# Patient Record
Sex: Male | Born: 1975 | Hispanic: Yes | Marital: Married | Smoking: Never smoker
Health system: Southern US, Community
[De-identification: ages and names within clinical notes are randomized; demographics above are authoritative.]

---

## 2019-04-19 ENCOUNTER — Encounter: Payer: Self-pay | Admitting: Emergency Medicine

## 2019-04-19 ENCOUNTER — Emergency Department
Admission: EM | Admit: 2019-04-19 | Discharge: 2019-04-19 | Disposition: A | Discharge status: Home / Self Care | Payer: Self-pay | Attending: Emergency Medicine | Admitting: Emergency Medicine

## 2019-04-19 ENCOUNTER — Other Ambulatory Visit: Payer: Self-pay

## 2019-04-19 ENCOUNTER — Emergency Department: Payer: Self-pay

## 2019-04-19 DIAGNOSIS — R0789 Other chest pain: Secondary | ICD-10-CM | POA: Insufficient documentation

## 2019-04-19 DIAGNOSIS — J189 Pneumonia, unspecified organism: Secondary | ICD-10-CM

## 2019-04-19 DIAGNOSIS — J181 Lobar pneumonia, unspecified organism: Secondary | ICD-10-CM | POA: Insufficient documentation

## 2019-04-19 DIAGNOSIS — Z20828 Contact with and (suspected) exposure to other viral communicable diseases: Secondary | ICD-10-CM | POA: Insufficient documentation

## 2019-04-19 LAB — BASIC METABOLIC PANEL
Anion gap: 8 (ref 5–15)
BUN: 20 mg/dL (ref 6–20)
CO2: 28 mmol/L (ref 22–32)
Calcium: 9.4 mg/dL (ref 8.9–10.3)
Chloride: 105 mmol/L (ref 98–111)
Creatinine, Ser: 0.91 mg/dL (ref 0.61–1.24)
GFR calc Af Amer: >60 mL/min (ref >60)
GFR calc non Af Amer: >60 mL/min (ref >60)
Glucose, Bld: 96 mg/dL (ref 70–99)
Potassium: 4 mmol/L (ref 3.5–5.1)
Sodium: 141 mmol/L (ref 135–145)

## 2019-04-19 LAB — CBC
HCT: 45.1 % (ref 39.0–52.0)
Hemoglobin: 15.4 g/dL (ref 13.0–17.0)
MCH: 30.4 pg (ref 26.0–34.0)
MCHC: 34.1 g/dL (ref 30.0–36.0)
MCV: 89.1 fL (ref 80.0–100.0)
Platelets: 200 10*3/uL (ref 150–400)
RBC: 5.06 MIL/uL (ref 4.22–5.81)
RDW: 12.1 % (ref 11.5–15.5)
WBC: 3.2 10*3/uL — ABNORMAL LOW (ref 4.0–10.5)
nRBC: 0 % (ref 0.0–0.2)

## 2019-04-19 LAB — TROPONIN I: Troponin I: <0.03 ng/mL (ref <0.03)

## 2019-04-19 MED ORDER — NAPROXEN 375 MG PO TABS: 375.0000 mg | ORAL_TABLET | Freq: Two times a day (BID) | ORAL | 0 refills | Status: AC

## 2019-04-19 MED ORDER — AZITHROMYCIN 250 MG PO TABS: ORAL_TABLET | ORAL | 0 refills | Status: DC

## 2019-04-19 MED ORDER — AZITHROMYCIN 250 MG PO TABS: ORAL_TABLET | ORAL | 0 refills | Status: AC

## 2019-04-19 MED ORDER — SODIUM CHLORIDE 0.9% FLUSH: 3.0000 mL | Freq: Once | INTRAVENOUS | Status: DC

## 2019-04-19 MED ORDER — KETOROLAC TROMETHAMINE 60 MG/2ML IM SOLN
60.0000 mg | Freq: Once | INTRAMUSCULAR | Status: AC
  Administered 2019-04-19: 60 mg via INTRAMUSCULAR
  Filled 2019-04-19: qty 2

## 2019-04-19 NOTE — ED Notes (Signed)
Pt reports intermittent chest pain over the last year. Pt reports over the last week it has been worse. Pt reports that the pain comes and goes, pt is not having pain right now. Pain is located in the left chest and does not radiate. Pt denies cardiac history. Pt reports that he did take 2 81 mg Aspirin today and that did help with the pain.

## 2019-04-19 NOTE — ED Triage Notes (Signed)
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  States some SOB with pain, denies n/v/d.  Pt presents A&Ox4, chest rise even and unlabored, using spanish interpreter. 

## 2019-04-19 NOTE — ED Provider Notes (Signed)
[facility] Emergency Department Provider Note  ____________________________________________   First MD Initiated Contact with Patient 04/19/19 1141     (approximate)  I have reviewed the triage vital signs and the nursing notes.   HISTORY  Chief Complaint Chest Pain    HPI Philip Pope is a 43 y.o. male  Here with intermittent left sided chest pain. 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 He states he often coughs at work 2/2 the dust from his stone and this makes it worse, after coughing. Denies any pleuritic component. No SOB, hemoptysis. No leg swelling. His pain is worse w/ movement, palpation. No diaphoresis. No alleviating factors other than rest away from work. Has not taken anything regularly for it.     History reviewed. No pertinent past medical history.  There are no active problems to display for this patient.   History reviewed. No pertinent surgical history.  Prior to Admission medications   Medication Sig Start Date End Date Taking? Authorizing Provider  azithromycin (ZITHROMAX Z-PAK) 250 MG tablet Take 2 tablets (500 mg) on  Day 1,  followed by 1 tablet (250 mg) once daily on Days 2 through 5. 04/19/19 04/24/19  Shaune PollackIsaacs, Renatta Shrieves, MD  naproxen (NAPROSYN) 375 MG tablet Take 1 tablet (375 mg total) by mouth 2 (two) times daily with a meal for 10 days. 04/19/19 04/29/19  Shaune PollackIsaacs, Junius Faucett, MD    Allergies Patient has no known allergies.  History reviewed. No pertinent family history.  Social History Social History   Tobacco Use  . Smoking status: Never Smoker  . Smokeless tobacco: Never Used  Substance Use Topics  . Alcohol use: Never    Frequency: Never  . Drug use: Never    Review of Systems    Review of Systems  Constitutional: Negative for chills, fatigue and  fever.  HENT: Negative for congestion and rhinorrhea.   Eyes: Negative for visual disturbance.  Respiratory: Positive for cough (when exposed to dust at work) and chest tightness. Negative for shortness of breath.   Cardiovascular: Positive for chest pain.  Gastrointestinal: Negative for abdominal pain, diarrhea, nausea and vomiting.  Genitourinary: Negative for dysuria and flank pain.  Skin: Negative for rash and wound.  Neurological: Negative for weakness and light-headedness.  All other systems reviewed and are negative.    ____________________________________________  PHYSICAL EXAM:      VITAL SIGNS: ED Triage Vitals  Enc Vitals Group     BP 04/19/19 1119 123/84     Pulse Rate 04/19/19 1119 71     Resp 04/19/19 1119 14     Temp 04/19/19 1119 98.3 F (36.8 C)     Temp Source 04/19/19 1119 Oral     SpO2 04/19/19 1119 99 %     Weight 04/19/19 1120 200 lb (90.7 kg)     Height 04/19/19 1120 5\' 10"  (1.778 m)     Head Circumference --      Peak Flow --      Pain Score 04/19/19 1120 4     Pain Loc --      Pain Edu? --      Excl. in GC? --      Physical Exam Vitals signs and nursing note reviewed.  Constitutional:      General: He is not in acute distress.  Appearance: He is well-developed.  HENT:     Head: Normocephalic and atraumatic.  Eyes:     Conjunctiva/sclera: Conjunctivae normal.     Pupils: Pupils are equal, round, and reactive to light.  Neck:     Musculoskeletal: Neck supple.  Cardiovascular:     Rate and Rhythm: Normal rate and regular rhythm.     Heart sounds: Normal heart sounds. No murmur. No friction rub.  Pulmonary:     Effort: Pulmonary effort is normal. No respiratory distress.     Breath sounds: Normal breath sounds. No wheezing or rales.  Chest:    Abdominal:     General: There is no distension.     Palpations: Abdomen is soft.     Tenderness: There is no abdominal tenderness.  Skin:    General: Skin is warm.     Capillary Refill:  Capillary refill takes less than 2 seconds.  Neurological:     Mental Status: He is alert and oriented to person, place, and time.     Motor: No abnormal muscle tone.       ____________________________________________   LABS (all labs ordered are listed, but only abnormal results are displayed)  Labs Reviewed  CBC - Abnormal; Notable for the following components:      Result Value   WBC 3.2 (*)    All other components within normal limits  NOVEL CORONAVIRUS, NAA (HOSPITAL ORDER, SEND-OUT TO REF LAB)  BASIC METABOLIC PANEL  TROPONIN I    ____________________________________________  EKG: NSR, VR 79. PR 154, QRS 86, QTc 424. Normal sinus rhythm, no ST-t changes. No acute ischemia or infarct. ________________________________________  RADIOLOGY All imaging, including plain films, CT scans, and ultrasounds, independently reviewed by me, and interpretations confirmed via formal radiology reads.  ED MD interpretation:    CXR: Subtle R-sided infiltrate noted. No PTX.  Official radiology report(s): Dg Chest 2 View  Result Date: 04/19/2019 CLINICAL DATA:  Chest pain. EXAM: CHEST - 2 VIEW COMPARISON:  None. FINDINGS: The heart, hila, and mediastinum are normal. No pneumothorax. Mild opacity seen in the lateral right upper lung. No other acute abnormalities. IMPRESSION: Mild opacity/infiltrate seen in the upper right lung, possibly developing pneumonia. Recommend follow-up to resolution. Electronically Signed   By: Gerome Samavid  Williams III M.D   On: 04/19/2019 12:37    ____________________________________________  PROCEDURES   Procedure(s) performed (including Critical Care):  Procedures  ____________________________________________  INITIAL IMPRESSION / MDM / ASSESSMENT AND PLAN / ED COURSE  As part of my medical decision making, I reviewed the following data within the electronic MEDICAL RECORD NUMBER Notes from prior ED visits and [place] Controlled Substance Database      *Philip Pope was evaluated in Emergency Department on 04/19/2019 for the symptoms described in the history of present illness. He was evaluated in the context of the global COVID-19 pandemic, which necessitated consideration that the patient might be at risk for infection with the SARS-CoV-2 virus that causes COVID-19. Institutional protocols and algorithms that pertain to the evaluation of patients at risk for COVID-19 are in a state of rapid change based on information released by regulatory bodies including the CDC and federal and state organizations. These policies and algorithms were followed during the patient's care in the ED.  Some ED evaluations and interventions may be delayed as a result of limited staffing during the pandemic.*      Medical Decision Making: 43 yo M here with atypical left upper chest wall pain. Pt also with cough,  worse when he is at work. Re: CP - EKG is normal with negative trop despite constant sx >6 hr today (though resolved just as labs drawn), and pain is highly atypical and reproducible on exam. I suspect MSK chest wall pain 2/2 his work [employer], [employer]. Low suspicion for ACS w/ HEART score <3 and neg trop despite constant sx. He is PERC negative. No LE swelling or signs of CHF or DVT. Pain is not sharp or tearing and I have a low suspicion for dissection. Will treat with anti-inflammatories.   Of note, pt does report cough and CXR read as possible early infiltrate. He is not hypoxic, febrile, and has no signs of sepsis. Will cover for possible early CAP though this does not correlate with his sx or pain. COVID testing performed as he works in close proximity with others with +CXR, will be f/u as outpt. Home quarantine instructions provided.  ____________________________________________  FINAL CLINICAL IMPRESSION(S) / ED DIAGNOSES  Final diagnoses:  Atypical chest pain  Community acquired pneumonia of right upper lobe of lung ([place])     MEDICATIONS GIVEN DURING THIS VISIT:   Medications  ketorolac (TORADOL) injection 60 mg (60 mg Intramuscular Given 04/19/19 1204)     ED Discharge Orders         Ordered    azithromycin (ZITHROMAX Z-PAK) 250 MG tablet  Status:  Discontinued     04/19/19 1334    naproxen (NAPROSYN) 375 MG tablet  2 times daily with meals     04/19/19 1334    azithromycin (ZITHROMAX Z-PAK) 250 MG tablet     04/19/19 1334           Note:  This document was prepared using Dragon voice recognition software and may include unintentional dictation errors.   Duffy Bruce, MD 04/19/19 [phone]

## 2019-04-20 LAB — NOVEL CORONAVIRUS, NAA (HOSP ORDER, SEND-OUT TO REF LAB; TAT 18-24 HRS): SARS-CoV-2, NAA: NOT DETECTED

## 2019-04-22 ENCOUNTER — Telehealth: Payer: Self-pay | Admitting: Emergency Medicine

## 2019-04-22 NOTE — Telephone Encounter (Signed)
Called patient with telephone interpretter and  informed of covid 19 result which is negative.

## 2021-05-25 IMAGING — CR CHEST - 2 VIEW
1 series · 2 of 2 positions shown · non-contrast
Comparison: None.

CLINICAL DATA: Chest pain.

EXAM:
CHEST - 2 VIEW

[Series 1: dg chest 2 view · 0.14mm/px · 2 of 2 slices shown]
[im 1/2]
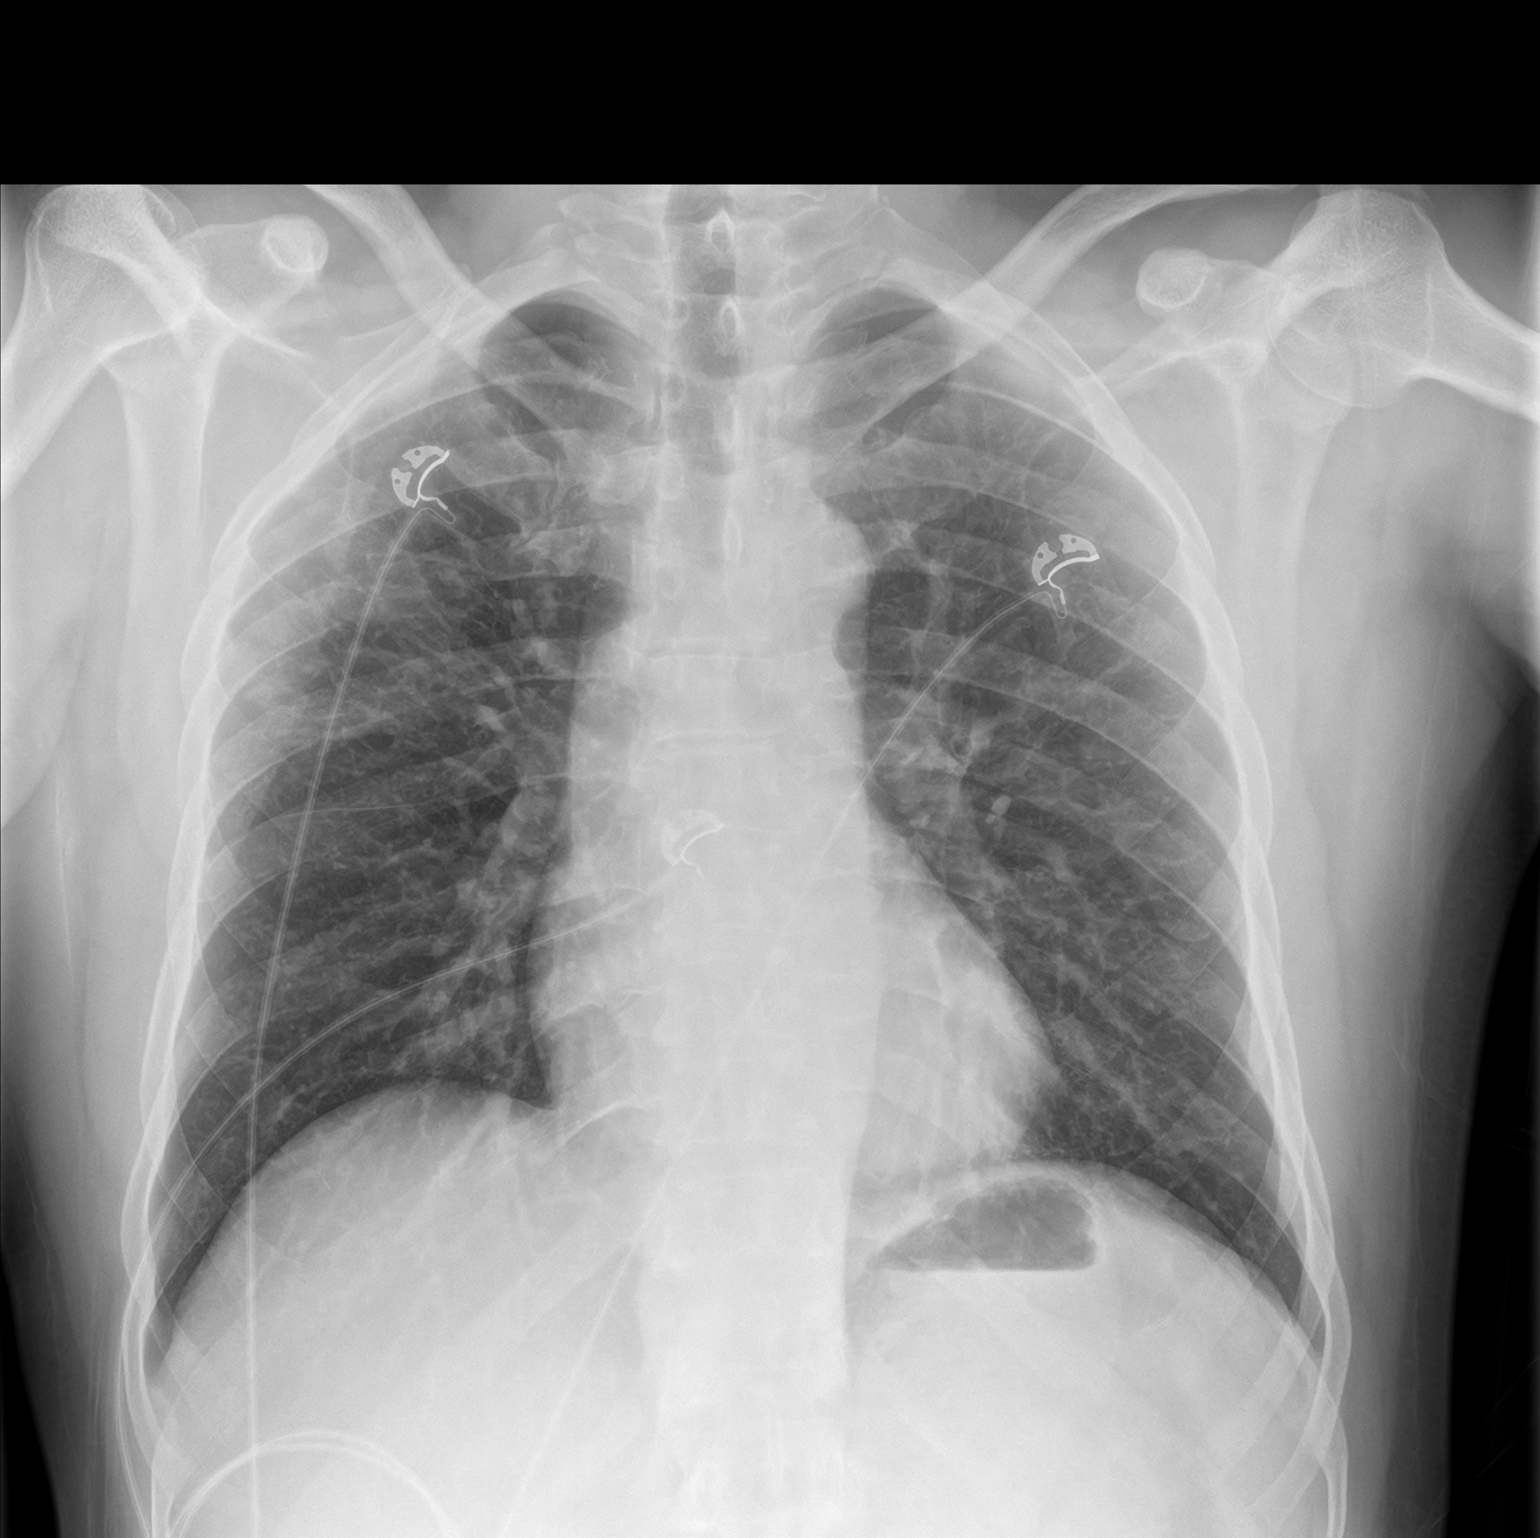
[im 2/2]
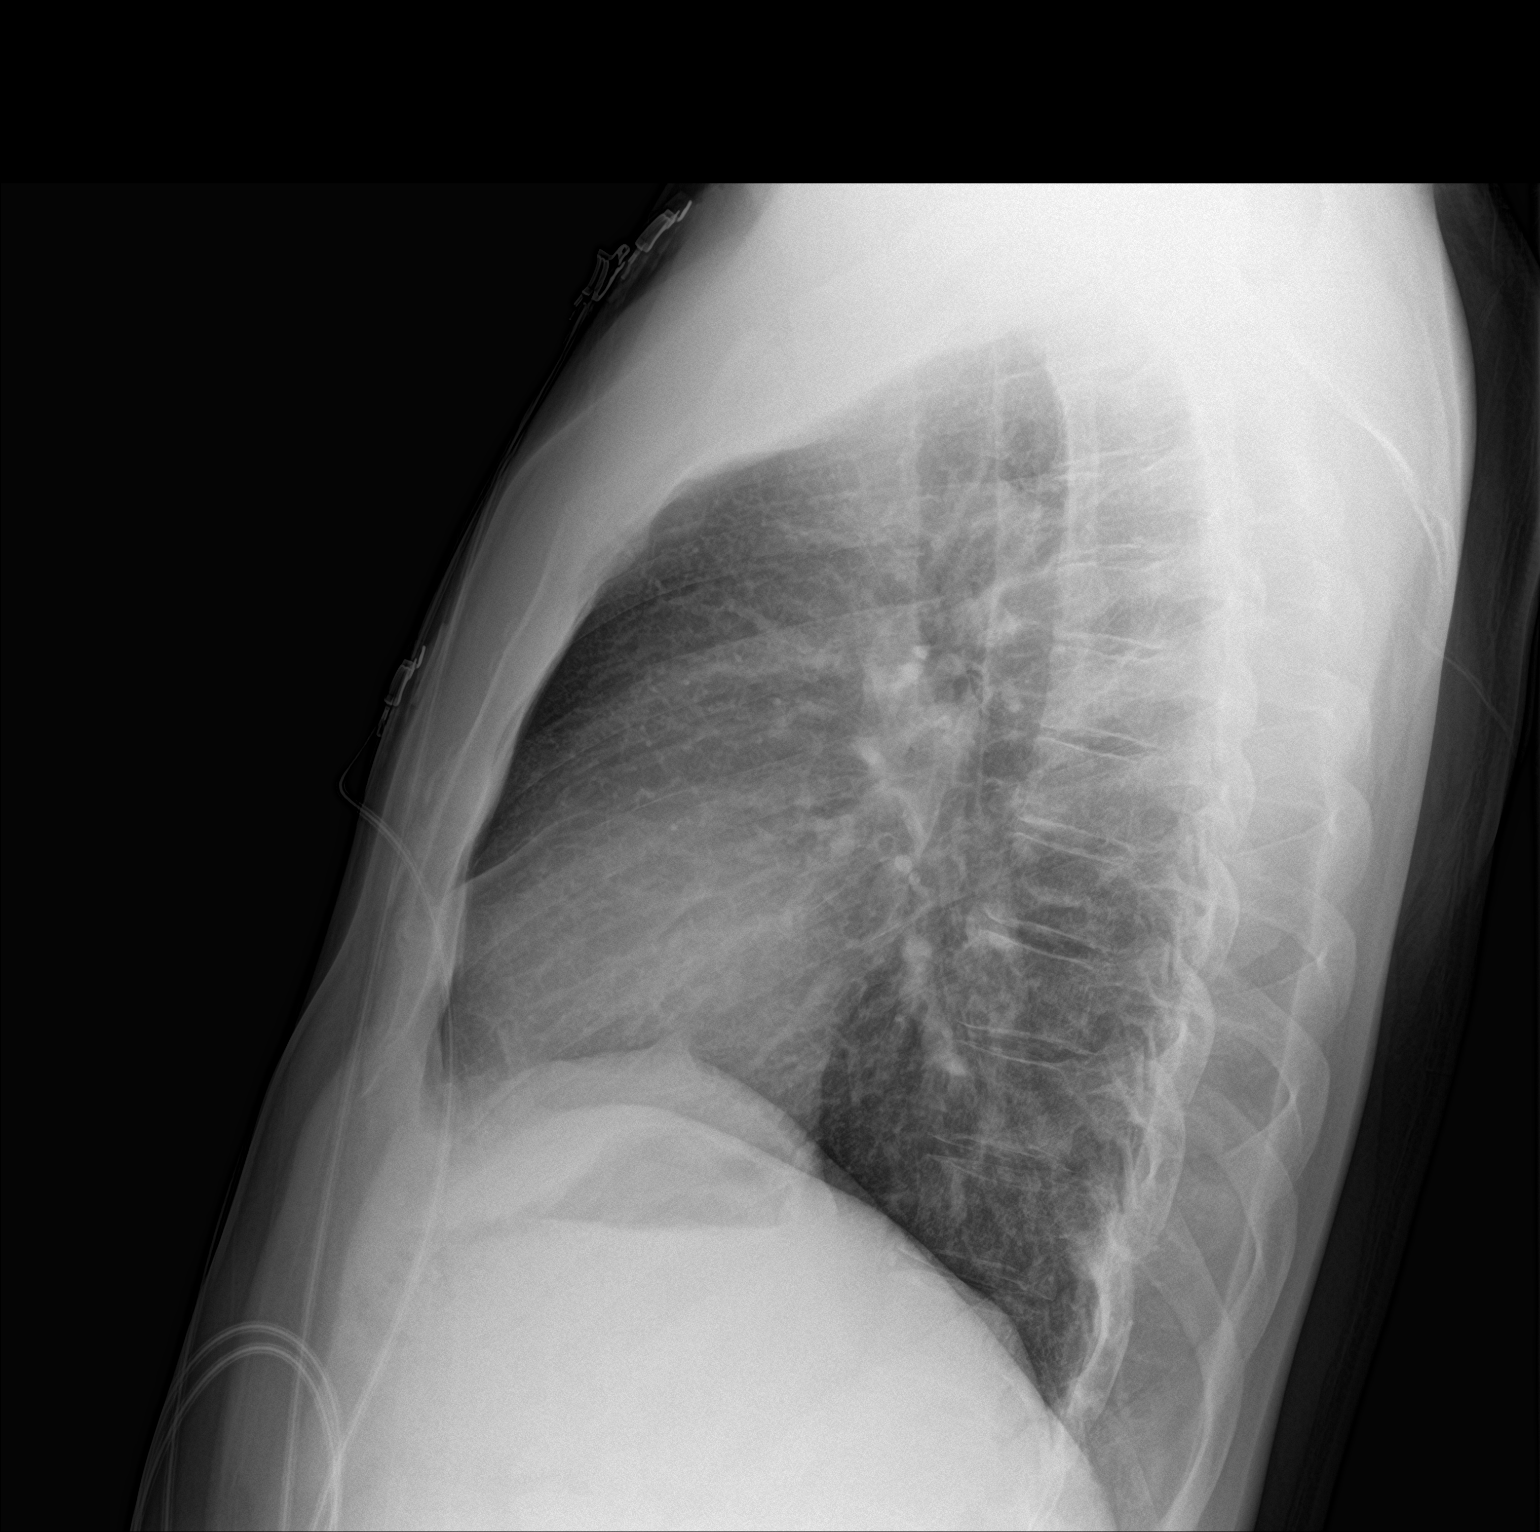

[2 of 2 positions shown; findings below may reference images not displayed]

FINDINGS: The heart, hila, and mediastinum are normal. No pneumothorax. Mild
opacity seen in the lateral right upper lung. No other acute
abnormalities.
IMPRESSION: Mild opacity/infiltrate seen in the upper right lung, possibly
developing pneumonia. Recommend follow-up to resolution.

## 2024-09-23 ENCOUNTER — Other Ambulatory Visit: Payer: Self-pay

## 2024-09-23 ENCOUNTER — Emergency Department: Payer: Self-pay

## 2024-09-23 ENCOUNTER — Emergency Department
Admission: EM | Admit: 2024-09-23 | Discharge: 2024-09-23 | Disposition: A | Discharge status: Home / Self Care | Payer: Self-pay | Attending: Emergency Medicine | Admitting: Emergency Medicine

## 2024-09-23 DIAGNOSIS — S39012A Strain of muscle, fascia and tendon of lower back, initial encounter: Secondary | ICD-10-CM | POA: Diagnosis not present

## 2024-09-23 DIAGNOSIS — S161XXA Strain of muscle, fascia and tendon at neck level, initial encounter: Secondary | ICD-10-CM | POA: Diagnosis not present

## 2024-09-23 DIAGNOSIS — Y92410 Unspecified street and highway as the place of occurrence of the external cause: Secondary | ICD-10-CM | POA: Insufficient documentation

## 2024-09-23 DIAGNOSIS — M542 Cervicalgia: Secondary | ICD-10-CM | POA: Diagnosis present

## 2024-09-23 MED ORDER — CYCLOBENZAPRINE HCL 10 MG PO TABS
10.0000 mg | ORAL_TABLET | Freq: Once | ORAL | Status: AC
  Administered 2024-09-23: 10 mg via ORAL
  Filled 2024-09-23: qty 1

## 2024-09-23 MED ORDER — CYCLOBENZAPRINE HCL 5 MG PO TABS: 5.0000 mg | ORAL_TABLET | Freq: Three times a day (TID) | ORAL | 0 refills | Status: AC | PRN

## 2024-09-23 MED ORDER — PREDNISONE 20 MG PO TABS
60.0000 mg | ORAL_TABLET | Freq: Once | ORAL | Status: AC
  Administered 2024-09-23: 60 mg via ORAL
  Filled 2024-09-23: qty 3

## 2024-09-23 MED ORDER — PREDNISONE 10 MG PO TABS: 10.0000 mg | ORAL_TABLET | Freq: Every day | ORAL | 0 refills | Status: AC

## 2024-09-23 MED ORDER — IBUPROFEN 600 MG PO TABS
600.0000 mg | ORAL_TABLET | Freq: Once | ORAL | Status: AC
  Administered 2024-09-23: 600 mg via ORAL
  Filled 2024-09-23: qty 1
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
## 2024-09-23 NOTE — Discharge Instructions (Addendum)
 Please take prednisone as prescribed daily for 6 days.  Take Flexeril up to 3 times daily as needed for muscle relaxation.  Do not take Flexeril and drive or operate equipment.  You may take Tylenol 1000 mg every 6 hours along with prednisone and Flexeril.  After completion of prednisone you may take ibuprofen in addition to Tylenol and Flexeril.  Follow-up with primary care provider or orthopedics in 1 week if no improvement.  Return to the ER for any worsening symptoms or urgent changes in your health.
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
